# Patient Record
Sex: Male | Born: 1990 | Race: White | Hispanic: No | Marital: Single | Smoking: Former smoker
Health system: Southern US, Community
[De-identification: ages and names within clinical notes are randomized; demographics above are authoritative.]

## PROBLEM LIST (undated history)

## (undated) DIAGNOSIS — I10 Essential (primary) hypertension: Secondary | ICD-10-CM

## (undated) HISTORY — PX: KNEE ARTHROSCOPY WITH ANTERIOR CRUCIATE LIGAMENT (ACL) REPAIR: SHX5644

---
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
## 2011-06-30 ENCOUNTER — Ambulatory Visit: Payer: Self-pay | Admitting: Gastroenterology

## 2011-07-02 LAB — PATHOLOGY REPORT

## 2016-02-03 ENCOUNTER — Other Ambulatory Visit: Payer: Self-pay | Admitting: Family Medicine

## 2016-02-04 LAB — CMP12+LP+TP+TSH+6AC+CBC/D/PLT
ALBUMIN: 4.5 g/dL (ref 3.5–5.5)
ALK PHOS: 96 IU/L (ref 39–117)
ALT: 29 IU/L (ref 0–44)
AST: 17 IU/L (ref 0–40)
Albumin/Globulin Ratio: 2 (ref 1.1–2.5)
BASOS ABS: 0 10*3/uL (ref 0.0–0.2)
BASOS: 0 %
BILIRUBIN TOTAL: 0.7 mg/dL (ref 0.0–1.2)
BUN/Creatinine Ratio: 16 (ref 8–19)
BUN: 16 mg/dL (ref 6–20)
CHOLESTEROL TOTAL: 200 mg/dL — AB (ref 100–199)
Calcium: 9.4 mg/dL (ref 8.7–10.2)
Chloride: 102 mmol/L (ref 96–106)
Chol/HDL Ratio: 4.9 ratio units (ref 0.0–5.0)
Creatinine, Ser: 1 mg/dL (ref 0.76–1.27)
EOS (ABSOLUTE): 0 10*3/uL (ref 0.0–0.4)
Eos: 1 %
Estimated CHD Risk: 1 times avg. (ref 0.0–1.0)
FREE THYROXINE INDEX: 2.9 (ref 1.2–4.9)
GFR calc Af Amer: 122 mL/min/{1.73_m2} (ref >59)
GFR calc non Af Amer: 106 mL/min/{1.73_m2} (ref >59)
GGT: 20 IU/L (ref 0–65)
GLOBULIN, TOTAL: 2.3 g/dL (ref 1.5–4.5)
Glucose: 90 mg/dL (ref 65–99)
HDL: 41 mg/dL (ref >39)
HEMATOCRIT: 48.9 % (ref 37.5–51.0)
HEMOGLOBIN: 16.5 g/dL (ref 12.6–17.7)
IMMATURE GRANS (ABS): 0 10*3/uL (ref 0.0–0.1)
Immature Granulocytes: 0 %
Iron: 148 ug/dL (ref 38–169)
LDH: 164 IU/L (ref 121–224)
LDL CALC: 124 mg/dL — AB (ref 0–99)
LYMPHS: 29 %
Lymphocytes Absolute: 2.1 10*3/uL (ref 0.7–3.1)
MCH: 29.2 pg (ref 26.6–33.0)
MCHC: 33.7 g/dL (ref 31.5–35.7)
MCV: 87 fL (ref 79–97)
MONOS ABS: 1 10*3/uL — AB (ref 0.1–0.9)
Monocytes: 14 %
Neutrophils Absolute: 4 10*3/uL (ref 1.4–7.0)
Neutrophils: 56 %
PLATELETS: 235 10*3/uL (ref 150–379)
Phosphorus: 3.2 mg/dL (ref 2.5–4.5)
Potassium: 4.1 mmol/L (ref 3.5–5.2)
RBC: 5.65 x10E6/uL (ref 4.14–5.80)
RDW: 14 % (ref 12.3–15.4)
SODIUM: 142 mmol/L (ref 134–144)
T3 UPTAKE RATIO: 30 % (ref 24–39)
T4, Total: 9.6 ug/dL (ref 4.5–12.0)
TRIGLYCERIDES: 177 mg/dL — AB (ref 0–149)
TSH: 3.71 u[IU]/mL (ref 0.450–4.500)
Total Protein: 6.8 g/dL (ref 6.0–8.5)
Uric Acid: 7.4 mg/dL (ref 3.7–8.6)
VLDL CHOLESTEROL CAL: 35 mg/dL (ref 5–40)
WBC: 7.2 10*3/uL (ref 3.4–10.8)

## 2016-02-04 LAB — HEPATITIS B SURFACE ANTIBODY,QUALITATIVE: Hep B Surface Ab, Qual: NONREACTIVE

## 2017-04-26 ENCOUNTER — Encounter: Payer: Self-pay | Admitting: Physician Assistant

## 2017-04-26 ENCOUNTER — Ambulatory Visit: Payer: Self-pay | Admitting: Physician Assistant

## 2017-04-26 VITALS — BP 110/69 | HR 69 | Temp 98.5°F

## 2017-04-26 DIAGNOSIS — L237 Allergic contact dermatitis due to plants, except food: Secondary | ICD-10-CM

## 2017-04-26 MED ORDER — DEXAMETHASONE SODIUM PHOSPHATE 10 MG/ML IJ SOLN
10.0000 mg | Freq: Once | INTRAMUSCULAR | Status: AC
  Administered 2017-04-26: 10 mg via INTRAMUSCULAR

## 2017-04-26 MED ORDER — TRIAMCINOLONE ACETONIDE 0.1 % EX CREA: 1.0000 "application " | TOPICAL_CREAM | Freq: Two times a day (BID) | CUTANEOUS | 0 refills | Status: AC

## 2017-04-26 NOTE — Progress Notes (Signed)
S: c/o itchy rash on arms, legs, was outside in yard and then broke out, sx for few days, tried multiple otc meds without relief, denies fever/chills  O: vitals wnl, nad, lungs c t a, cv rrr, skin with small raised red areas on arms, large patches on back of knee and thigh,  some with streaks/blisters, no drainage, n/v intact  A: acute contact dermatitis  P: decadron 10 mg im, triamcinolone cream

## 2017-05-18 ENCOUNTER — Ambulatory Visit: Payer: Self-pay | Admitting: Physician Assistant

## 2017-05-27 ENCOUNTER — Ambulatory Visit
Admission: RE | Admit: 2017-05-27 | Discharge: 2017-05-27 | Disposition: A | Discharge status: Home / Self Care | Payer: Worker's Compensation | Source: Ambulatory Visit | Attending: Family | Admitting: Family

## 2017-05-27 ENCOUNTER — Other Ambulatory Visit: Payer: Self-pay | Admitting: Family

## 2017-05-27 DIAGNOSIS — R52 Pain, unspecified: Secondary | ICD-10-CM | POA: Diagnosis not present

## 2017-05-27 DIAGNOSIS — M25561 Pain in right knee: Secondary | ICD-10-CM | POA: Insufficient documentation

## 2017-06-23 ENCOUNTER — Ambulatory Visit: Payer: Self-pay | Admitting: Physician Assistant

## 2017-06-23 ENCOUNTER — Encounter: Payer: Self-pay | Admitting: Physician Assistant

## 2017-06-23 VITALS — BP 110/80 | HR 95 | Temp 98.5°F | Resp 16 | Ht 77.0 in | Wt 303.0 lb

## 2017-06-23 DIAGNOSIS — Z0000 Encounter for general adult medical examination without abnormal findings: Secondary | ICD-10-CM

## 2017-06-23 DIAGNOSIS — Z008 Encounter for other general examination: Secondary | ICD-10-CM

## 2017-06-23 DIAGNOSIS — Z0189 Encounter for other specified special examinations: Secondary | ICD-10-CM

## 2017-06-23 NOTE — Progress Notes (Signed)
S: pt here for wellness physical and biometrics for insurance purposes, no complaints ros neg. PMH: neg Social: neg Fam: mother has htn, father had bilateral PEs, no known cause except for ?air travel, takes coumadin daily  O: vitals wnl, nad, ENT wnl, neck supple no lymph, lungs c t a, cv rrr, abd soft nontender bs normal all 4 quads  A: wellness, biometric physical  P: labs

## 2017-06-24 LAB — CMP12+LP+TP+TSH+6AC+CBC/D/PLT
A/G RATIO: 1.9 (ref 1.2–2.2)
ALBUMIN: 4.7 g/dL (ref 3.5–5.5)
ALT: 34 IU/L (ref 0–44)
AST: 18 IU/L (ref 0–40)
Alkaline Phosphatase: 80 IU/L (ref 39–117)
BUN/Creatinine Ratio: 11 (ref 9–20)
BUN: 11 mg/dL (ref 6–20)
Basophils Absolute: 0 10*3/uL (ref 0.0–0.2)
Basos: 0 %
Bilirubin Total: 0.2 mg/dL (ref 0.0–1.2)
CREATININE: 0.96 mg/dL (ref 0.76–1.27)
Calcium: 10.1 mg/dL (ref 8.7–10.2)
Chloride: 106 mmol/L (ref 96–106)
Chol/HDL Ratio: 4.6 ratio (ref 0.0–5.0)
Cholesterol, Total: 211 mg/dL — ABNORMAL HIGH (ref 100–199)
EOS (ABSOLUTE): 0.1 10*3/uL (ref 0.0–0.4)
Eos: 1 %
Estimated CHD Risk: 0.9 times avg. (ref 0.0–1.0)
Free Thyroxine Index: 2.3 (ref 1.2–4.9)
GFR calc Af Amer: 126 mL/min/{1.73_m2} (ref >59)
GFR calc non Af Amer: 109 mL/min/{1.73_m2} (ref >59)
GGT: 26 IU/L (ref 0–65)
Globulin, Total: 2.5 g/dL (ref 1.5–4.5)
Glucose: 106 mg/dL — ABNORMAL HIGH (ref 65–99)
HDL: 46 mg/dL (ref >39)
Hematocrit: 47.8 % (ref 37.5–51.0)
Hemoglobin: 16 g/dL (ref 13.0–17.7)
IMMATURE GRANS (ABS): 0 10*3/uL (ref 0.0–0.1)
IRON: 47 ug/dL (ref 38–169)
Immature Granulocytes: 1 %
LDH: 191 IU/L (ref 121–224)
LDL Calculated: 137 mg/dL — ABNORMAL HIGH (ref 0–99)
Lymphocytes Absolute: 2 10*3/uL (ref 0.7–3.1)
Lymphs: 25 %
MCH: 29.4 pg (ref 26.6–33.0)
MCHC: 33.5 g/dL (ref 31.5–35.7)
MCV: 88 fL (ref 79–97)
Monocytes Absolute: 1.1 10*3/uL — ABNORMAL HIGH (ref 0.1–0.9)
Monocytes: 15 %
Neutrophils Absolute: 4.6 10*3/uL (ref 1.4–7.0)
Neutrophils: 58 %
PHOSPHORUS: 3 mg/dL (ref 2.5–4.5)
Platelets: 249 10*3/uL (ref 150–379)
Potassium: 4.3 mmol/L (ref 3.5–5.2)
RBC: 5.44 x10E6/uL (ref 4.14–5.80)
RDW: 14 % (ref 12.3–15.4)
SODIUM: 145 mmol/L — AB (ref 134–144)
T3 Uptake Ratio: 26 % (ref 24–39)
T4, Total: 8.9 ug/dL (ref 4.5–12.0)
TSH: 4.64 u[IU]/mL — ABNORMAL HIGH (ref 0.450–4.500)
Total Protein: 7.2 g/dL (ref 6.0–8.5)
Triglycerides: 140 mg/dL (ref 0–149)
URIC ACID: 6.7 mg/dL (ref 3.7–8.6)
VLDL Cholesterol Cal: 28 mg/dL (ref 5–40)
WBC: 7.9 10*3/uL (ref 3.4–10.8)

## 2017-06-24 LAB — VITAMIN D 25 HYDROXY (VIT D DEFICIENCY, FRACTURES): Vit D, 25-Hydroxy: 28.5 ng/mL — ABNORMAL LOW (ref 30.0–100.0)

## 2018-12-02 ENCOUNTER — Ambulatory Visit
Admission: EM | Admit: 2018-12-02 | Discharge: 2018-12-02 | Disposition: A | Discharge status: Home / Self Care | Payer: Managed Care, Other (non HMO) | Attending: Family Medicine | Admitting: Family Medicine

## 2018-12-02 ENCOUNTER — Other Ambulatory Visit: Payer: Self-pay

## 2018-12-02 ENCOUNTER — Encounter: Payer: Self-pay | Admitting: Emergency Medicine

## 2018-12-02 DIAGNOSIS — Z0130 Encounter for examination of blood pressure without abnormal findings: Secondary | ICD-10-CM

## 2018-12-02 NOTE — Discharge Instructions (Addendum)
Blood pressure reading today at urgent care was normal: 132/86 Vital signs were normal: including pulse of 80, respirations and O2 saturation of 98%

## 2018-12-02 NOTE — ED Triage Notes (Addendum)
Patient in today stating that his blood pressure has been 160s/110s for the last few days. Patient states that he has had this problem off & on. 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 He states he needs medication and a note stating that he can do physical activity.

## 2018-12-02 NOTE — ED Provider Notes (Signed)
[facility]    CSN: [id number] Arrival date & time: 12/02/18  1605     History   Chief Complaint Chief Complaint  Patient presents with  . Hypertension    HPI Maudry DiegoMichael Alexander Ovens is a 27 y.o. male.   27 yo male here for blood pressure check. States he's had it checked in class for BLET and it's been high, however patient states that at home he checks it and it's normal. Denies any chest pains or shortness of breath.   The history is provided by the patient.  Hypertension     History reviewed. No pertinent past medical history.  There are no active problems to display for this patient.   Past Surgical History:  Procedure Laterality Date  . KNEE ARTHROSCOPY WITH ANTERIOR CRUCIATE LIGAMENT (ACL) REPAIR Right        Home Medications    Prior to Admission medications   Medication Sig Start Date End Date Taking? Authorizing Provider  triamcinolone cream (KENALOG) 0.1 % Apply 1 application topically 2 (two) times daily. Patient not taking: Reported on 06/23/2017 04/26/17   Faythe GheeFisher, Susan W, PA-C    Family History Family History  Problem Relation Age of Onset  . Hypertension Mother   . Pulmonary embolism Father   . Early death Neg Hx   . Diabetes Neg Hx   . Stroke Neg Hx   . Cancer Neg Hx     Social History Social History   Tobacco Use  . Smoking status: Former Smoker    Last attempt to quit: 12/02/2013    Years since quitting: 5.0  . Smokeless tobacco: Never Used  Substance Use Topics  . Alcohol use: No  . Drug use: No     Allergies   Patient has no known allergies.   Review of Systems Review of Systems   Physical Exam Triage Vital Signs ED Triage Vitals  Enc Vitals Group     BP 12/02/18 1621 132/86     Pulse Rate 12/02/18 1621 80     Resp 12/02/18 1621 16     Temp 12/02/18 1621 98.1 F (36.7 C)     Temp Source 12/02/18 1621 Oral     SpO2 12/02/18 1621 98 %     Weight 12/02/18 1622 290 lb (131.5 kg)     Height 12/02/18  1622 6\' 5"  (1.956 m)     Head Circumference --      Peak Flow --      Pain Score 12/02/18 1621 0     Pain Loc --      Pain Edu? --      Excl. in GC? --    No data found.  Updated Vital Signs BP 132/86 (BP Location: Left Arm)   Pulse 80   Temp 98.1 F (36.7 C) (Oral)   Resp 16   Ht 6\' 5"  (1.956 m)   Wt 131.5 kg   SpO2 98%   BMI 34.39 kg/m   Visual Acuity Right Eye Distance:   Left Eye Distance:   Bilateral Distance:    Right Eye Near:   Left Eye Near:    Bilateral Near:     Physical Exam  Constitutional: He appears well-developed and well-nourished. No distress.  Skin: He is not diaphoretic.  Nursing note and vitals reviewed.    UC Treatments / Results  Labs (all labs ordered are listed, but only abnormal results are displayed) Labs Reviewed - No data to display  EKG None  Radiology  No results found.  Procedures Procedures (including critical care time)  Medications Ordered in UC Medications - No data to display  Initial Impression / Assessment and Plan / UC Course  I have reviewed the triage vital signs and the nursing notes.  Pertinent labs & imaging results that were available during my care of the patient were reviewed by me and considered in my medical decision making (see chart for details).      Final Clinical Impressions(s) / UC Diagnoses   Final diagnoses:  Blood pressure check     Discharge Instructions     Blood pressure reading today at urgent care was normal: 132/86 Vital signs were normal: including pulse of 80, respirations and O2 saturation of 98%    ED Prescriptions    None     1. diagnosis reviewed with patient 2. Recommend patient establish care with a primary care provider 3. Continue monitoring  4. Follow-up prn Controlled Substance Prescriptions [place] Controlled Substance Registry consulted? Not Applicable   Payton Mccallum, MD 12/02/18 [phone]

## 2019-10-09 ENCOUNTER — Emergency Department
Admission: EM | Admit: 2019-10-09 | Discharge: 2019-10-09 | Disposition: A | Discharge status: Home / Self Care | Payer: Managed Care, Other (non HMO) | Attending: Emergency Medicine | Admitting: Emergency Medicine

## 2019-10-09 ENCOUNTER — Other Ambulatory Visit: Payer: Self-pay

## 2019-10-09 ENCOUNTER — Emergency Department: Payer: Managed Care, Other (non HMO)

## 2019-10-09 ENCOUNTER — Encounter: Payer: Self-pay | Admitting: Emergency Medicine

## 2019-10-09 DIAGNOSIS — F17200 Nicotine dependence, unspecified, uncomplicated: Secondary | ICD-10-CM

## 2019-10-09 DIAGNOSIS — R002 Palpitations: Secondary | ICD-10-CM | POA: Diagnosis not present

## 2019-10-09 DIAGNOSIS — F17290 Nicotine dependence, other tobacco product, uncomplicated: Secondary | ICD-10-CM | POA: Insufficient documentation

## 2019-10-09 DIAGNOSIS — Z789 Other specified health status: Secondary | ICD-10-CM

## 2019-10-09 DIAGNOSIS — F1090 Alcohol use, unspecified, uncomplicated: Secondary | ICD-10-CM

## 2019-10-09 DIAGNOSIS — R000 Tachycardia, unspecified: Secondary | ICD-10-CM | POA: Diagnosis present

## 2019-10-09 LAB — URINE DRUG SCREEN, QUALITATIVE (ARMC ONLY)
Amphetamines, Ur Screen: NOT DETECTED
Barbiturates, Ur Screen: NOT DETECTED
Benzodiazepine, Ur Scrn: NOT DETECTED
Cannabinoid 50 Ng, Ur ~~LOC~~: NOT DETECTED
Cocaine Metabolite,Ur ~~LOC~~: NOT DETECTED
MDMA (Ecstasy)Ur Screen: NOT DETECTED
Methadone Scn, Ur: NOT DETECTED
Opiate, Ur Screen: NOT DETECTED
Phencyclidine (PCP) Ur S: NOT DETECTED
Tricyclic, Ur Screen: NOT DETECTED

## 2019-10-09 LAB — CBC
HCT: 47.7 % (ref 39.0–52.0)
Hemoglobin: 16.2 g/dL (ref 13.0–17.0)
MCH: 29.9 pg (ref 26.0–34.0)
MCHC: 34 g/dL (ref 30.0–36.0)
MCV: 88 fL (ref 80.0–100.0)
Platelets: 265 10*3/uL (ref 150–400)
RBC: 5.42 MIL/uL (ref 4.22–5.81)
RDW: 12.9 % (ref 11.5–15.5)
WBC: 9.1 10*3/uL (ref 4.0–10.5)
nRBC: 0 % (ref 0.0–0.2)

## 2019-10-09 LAB — BASIC METABOLIC PANEL
Anion gap: 7 (ref 5–15)
BUN: 15 mg/dL (ref 6–20)
CO2: 24 mmol/L (ref 22–32)
Calcium: 9.4 mg/dL (ref 8.9–10.3)
Chloride: 109 mmol/L (ref 98–111)
Creatinine, Ser: 1.2 mg/dL (ref 0.61–1.24)
GFR calc Af Amer: >60 mL/min (ref >60)
GFR calc non Af Amer: >60 mL/min (ref >60)
Glucose, Bld: 110 mg/dL — ABNORMAL HIGH (ref 70–99)
Potassium: 3.9 mmol/L (ref 3.5–5.1)
Sodium: 140 mmol/L (ref 135–145)

## 2019-10-09 LAB — TROPONIN I (HIGH SENSITIVITY)
Troponin I (High Sensitivity): 12 ng/L (ref <18)
Troponin I (High Sensitivity): 6 ng/L (ref <18)

## 2019-10-09 NOTE — ED Triage Notes (Signed)
Pt arrives POV to triage with c/o rapid heart rate. Pt states that he noticed it around 0100 and has not been able to sleep because of it. Pt is in NAD.

## 2019-10-09 NOTE — ED Provider Notes (Signed)
[facility] Emergency Department Provider Note  ____________________________________________  Time seen: Approximately 6:14 AM  I have reviewed the triage vital signs and the nursing notes.   HISTORY  Chief Complaint Tachycardia   HPI Justin Watts is a 28 y.o. male no significant past medical history who presents for evaluation of palpitations.  Patient reports that he kept having palpitations every time he was about to fall asleep this evening.  Had some muscle twitching/spasms on the right side of his chest wall associated with it.  Patient reports that he drank a lot of alcohol last night.  Also he used a new nicotine vape liquid.  He reports having a similar episode several months ago also when trying a new nicotine vape.  He denies drug use or marijuana.  He denies chest pain or shortness of breath, no fever or chills, no cough, no nausea, vomiting, and abdominal pain.  No personal history of blood clots, no recent travel immobilization, no leg pain or swelling, no hemoptysis or exogenous hormones.   PMH None - reviewed  Past Surgical History:  Procedure Laterality Date  . KNEE ARTHROSCOPY WITH ANTERIOR CRUCIATE LIGAMENT (ACL) REPAIR Right     Prior to Admission medications   Medication Sig Start Date End Date Taking? Authorizing Provider  triamcinolone cream (KENALOG) 0.1 % Apply 1 application topically 2 (two) times daily. Patient not taking: Reported on 06/23/2017 04/26/17   Faythe Ghee, PA-C    Allergies Patient has no known allergies.  Family History  Problem Relation Age of Onset  . Hypertension Mother   . Pulmonary embolism Father   . Early death Neg Hx   . Diabetes Neg Hx   . Stroke Neg Hx   . Cancer Neg Hx     Social History Social History   Tobacco Use  . Smoking status: Former Smoker    Types: E-cigarettes    Quit date: 12/02/2013    Years since quitting: 5.8  . Smokeless tobacco: Never Used  Substance Use  Topics  . Alcohol use: No  . Drug use: No    Review of Systems  Constitutional: Negative for fever. Eyes: Negative for visual changes. ENT: Negative for sore throat. Neck: No neck pain  Cardiovascular: Negative for chest pain. + palpitations Respiratory: Negative for shortness of breath. Gastrointestinal: Negative for abdominal pain, vomiting or diarrhea. Genitourinary: Negative for dysuria. Musculoskeletal: Negative for back pain. Skin: Negative for rash. Neurological: Negative for headaches, weakness or numbness. Psych: No SI or HI  ____________________________________________   PHYSICAL EXAM:  VITAL SIGNS: ED Triage Vitals  Enc Vitals Group     BP 10/09/19 0251 (!) 149/99     Pulse Rate 10/09/19 0251 96     Resp 10/09/19 0251 18     Temp 10/09/19 0251 98.5 F (36.9 C)     Temp Source 10/09/19 0251 Oral     SpO2 10/09/19 0251 98 %     Weight 10/09/19 0248 280 lb (127 kg)     Height 10/09/19 0248 6\' 5"  (1.956 m)     Head Circumference --      Peak Flow --      Pain Score 10/09/19 0248 2     Pain Loc --      Pain Edu? --      Excl. in GC? --     Constitutional: Alert and oriented. Well appearing and in no apparent distress. HEENT:      Head: Normocephalic and atraumatic.  Eyes: Conjunctivae are normal. Sclera is non-icteric.       Mouth/Throat: Mucous membranes are moist.       Neck: Supple with no signs of meningismus. Cardiovascular: Regular rate and rhythm. No murmurs, gallops, or rubs. 2+ symmetrical distal pulses are present in all extremities. No JVD. Respiratory: Normal respiratory effort. Lungs are clear to auscultation bilaterally. No wheezes, crackles, or rhonchi.  Gastrointestinal: Soft, non tender, and non distended with positive bowel sounds. No rebound or guarding. Musculoskeletal: Nontender with normal range of motion in all extremities. No edema, cyanosis, or erythema of extremities. Neurologic: Normal speech and language. Face is  symmetric. Moving all extremities. No gross focal neurologic deficits are appreciated. Skin: Skin is warm, dry and intact. No rash noted. Psychiatric: Mood and affect are normal. Speech and behavior are normal.  ____________________________________________   LABS (all labs ordered are listed, but only abnormal results are displayed)  Labs Reviewed  BASIC METABOLIC PANEL - Abnormal; Notable for the following components:      Result Value   Glucose, Bld 110 (*)    All other components within normal limits  CBC  URINE DRUG SCREEN, QUALITATIVE ([facility] ONLY)  TROPONIN I (HIGH SENSITIVITY)  TROPONIN I (HIGH SENSITIVITY)  TROPONIN I (HIGH SENSITIVITY)   ____________________________________________  EKG  ED ECG REPORT I, Rudene Re, the attending physician, personally viewed and interpreted this ECG.  Normal sinus rhythm, normal intervals, normal axis, no STE or depressions, no evidence of HOCM, AV block, delta wave, ARVD, prolonged QTc, WPW, or Brugada.   ____________________________________________  RADIOLOGY  I have personally reviewed the images performed during this visit and I agree with the Radiologist's read.   Interpretation by Radiologist:  Dg Chest 2 View  Result Date: 10/09/2019 CLINICAL DATA:  Palpitations EXAM: CHEST - 2 VIEW COMPARISON:  None. FINDINGS: No consolidation, features of edema, pneumothorax, or effusion. Pulmonary vascularity is normally distributed. The cardiomediastinal contours are unremarkable. No acute osseous or soft tissue abnormality. IMPRESSION: No acute cardiopulmonary abnormality. Electronically Signed   By: Lovena Le M.D.   On: 10/09/2019 03:15     ____________________________________________   PROCEDURES  Procedure(s) performed: None Procedures Critical Care performed:  None ____________________________________________   INITIAL IMPRESSION / ASSESSMENT AND PLAN / ED COURSE  28 y.o. male no significant past medical history  who presents for evaluation of palpitations the setting of using new nicotine vape and heavy alcohol use.  EKG with no evidence of dysrhythmias or ischemia.  Vitals are within normal limits.  No murmurs, lungs are clear to auscultation.  Patient is PERC negative and has had full resolution of his symptoms plus no CP, SOB, hypoxia, tachycardia, or tachypnea.  No COVID-like symptoms. Labs dehydration, AKI, electrolyte abnormalities, anemia.  Troponin x2-.  Monitor on telemetry with no signs of dysrhythmias.  Recommended increase oral hydration avoiding alcohol and smoking for the next 48 hours.  Also recommended close follow-up with PCP for possible Holter monitoring if these episodes keep happening.  Discussed my return precautions for recurrence of palpitations, chest pain, shortness of breath, dizziness.       As part of my medical decision making, I reviewed the following data within the [place] notes reviewed and incorporated, Labs reviewed , EKG interpreted , Old chart reviewed, Radiograph reviewed , Notes from prior ED visits and [place] Controlled Substance Database   Patient was evaluated in Emergency Department today for the symptoms described in the history of present illness. Patient was evaluated in the  context of the global COVID-19 pandemic, which necessitated consideration that the patient might be at risk for infection with the SARS-CoV-2 virus that causes COVID-19. Institutional protocols and algorithms that pertain to the evaluation of patients at risk for COVID-19 are in a state of rapid change based on information released by regulatory bodies including the CDC and federal and state organizations. These policies and algorithms were followed during the patient's care in the ED.   ____________________________________________   FINAL CLINICAL IMPRESSION(S) / ED DIAGNOSES   Final diagnoses:  Palpitations  Heavy alcohol consumption  Vaping nicotine  dependence, non-tobacco product      NEW MEDICATIONS STARTED DURING THIS VISIT:  ED Discharge Orders    None       Note:  This document was prepared using Dragon voice recognition software and may include unintentional dictation errors.    Nita SickleVeronese, [place], MD 10/09/19 [phone]

## 2019-10-09 NOTE — ED Notes (Signed)
Report to kim, rn.  

## 2019-10-09 NOTE — ED Notes (Signed)
Pt states he was going to sleep around 0100 tonight when he began to feel his heart race. Pt states has happened in past when he switched to a different vape pen, which he did again yesterday. Pt states he did feel shob during episode, denies fever, cough. Pt appears in no acute distress.

## 2019-11-02 ENCOUNTER — Other Ambulatory Visit: Payer: Self-pay

## 2019-11-02 DIAGNOSIS — Z20822 Contact with and (suspected) exposure to covid-19: Secondary | ICD-10-CM

## 2019-11-03 LAB — NOVEL CORONAVIRUS, NAA: SARS-CoV-2, NAA: NOT DETECTED

## 2020-09-17 ENCOUNTER — Other Ambulatory Visit: Payer: Self-pay

## 2020-09-17 ENCOUNTER — Emergency Department: Payer: Managed Care, Other (non HMO)

## 2020-09-17 ENCOUNTER — Emergency Department
Admission: EM | Admit: 2020-09-17 | Discharge: 2020-09-17 | Disposition: A | Discharge status: Home / Self Care | Payer: Managed Care, Other (non HMO) | Attending: Student in an Organized Health Care Education/Training Program | Admitting: Student in an Organized Health Care Education/Training Program

## 2020-09-17 DIAGNOSIS — R109 Unspecified abdominal pain: Secondary | ICD-10-CM | POA: Insufficient documentation

## 2020-09-17 DIAGNOSIS — Z87891 Personal history of nicotine dependence: Secondary | ICD-10-CM | POA: Insufficient documentation

## 2020-09-17 DIAGNOSIS — I10 Essential (primary) hypertension: Secondary | ICD-10-CM | POA: Insufficient documentation

## 2020-09-17 DIAGNOSIS — R0789 Other chest pain: Secondary | ICD-10-CM | POA: Diagnosis present

## 2020-09-17 DIAGNOSIS — Z96651 Presence of right artificial knee joint: Secondary | ICD-10-CM | POA: Diagnosis not present

## 2020-09-17 DIAGNOSIS — Z20822 Contact with and (suspected) exposure to covid-19: Secondary | ICD-10-CM | POA: Diagnosis not present

## 2020-09-17 DIAGNOSIS — R079 Chest pain, unspecified: Secondary | ICD-10-CM

## 2020-09-17 DIAGNOSIS — R10A1 Flank pain, right side: Secondary | ICD-10-CM

## 2020-09-17 HISTORY — DX: Essential (primary) hypertension: I10

## 2020-09-17 LAB — CBC
HCT: 47.4 % (ref 39.0–52.0)
Hemoglobin: 16.9 g/dL (ref 13.0–17.0)
MCH: 30.8 pg (ref 26.0–34.0)
MCHC: 35.7 g/dL (ref 30.0–36.0)
MCV: 86.3 fL (ref 80.0–100.0)
Platelets: 277 10*3/uL (ref 150–400)
RBC: 5.49 MIL/uL (ref 4.22–5.81)
RDW: 13.3 % (ref 11.5–15.5)
WBC: 8.3 10*3/uL (ref 4.0–10.5)
nRBC: 0 % (ref 0.0–0.2)

## 2020-09-17 LAB — BASIC METABOLIC PANEL
Anion gap: 7 (ref 5–15)
BUN: 13 mg/dL (ref 6–20)
CO2: 26 mmol/L (ref 22–32)
Calcium: 9.1 mg/dL (ref 8.9–10.3)
Chloride: 105 mmol/L (ref 98–111)
Creatinine, Ser: 1.02 mg/dL (ref 0.61–1.24)
GFR calc Af Amer: >60 mL/min (ref >60)
GFR calc non Af Amer: >60 mL/min (ref >60)
Glucose, Bld: 99 mg/dL (ref 70–99)
Potassium: 4 mmol/L (ref 3.5–5.1)
Sodium: 138 mmol/L (ref 135–145)

## 2020-09-17 LAB — RESP PANEL BY RT PCR (RSV, FLU A&B, COVID)
Influenza A by PCR: NEGATIVE
Influenza B by PCR: NEGATIVE
Respiratory Syncytial Virus by PCR: NEGATIVE
SARS Coronavirus 2 by RT PCR: NEGATIVE

## 2020-09-17 LAB — URINALYSIS, COMPLETE (UACMP) WITH MICROSCOPIC
Bacteria, UA: NONE SEEN
Bilirubin Urine: NEGATIVE
Glucose, UA: NEGATIVE mg/dL
Hgb urine dipstick: NEGATIVE
Ketones, ur: NEGATIVE mg/dL
Leukocytes,Ua: NEGATIVE
Nitrite: NEGATIVE
Protein, ur: NEGATIVE mg/dL
Specific Gravity, Urine: 1.024 (ref 1.005–1.030)
pH: 5 (ref 5.0–8.0)

## 2020-09-17 LAB — TROPONIN I (HIGH SENSITIVITY)
Troponin I (High Sensitivity): 4 ng/L (ref <18)
Troponin I (High Sensitivity): 3 ng/L (ref <18)

## 2020-09-17 LAB — HEPATIC FUNCTION PANEL
ALT: 49 U/L — ABNORMAL HIGH (ref 0–44)
AST: 25 U/L (ref 15–41)
Albumin: 3.9 g/dL (ref 3.5–5.0)
Alkaline Phosphatase: 58 U/L (ref 38–126)
Bilirubin, Direct: 0.1 mg/dL (ref 0.0–0.2)
Indirect Bilirubin: 0.6 mg/dL (ref 0.3–0.9)
Total Bilirubin: 0.7 mg/dL (ref 0.3–1.2)
Total Protein: 7.1 g/dL (ref 6.5–8.1)

## 2020-09-17 LAB — FIBRIN DERIVATIVES D-DIMER (ARMC ONLY): Fibrin derivatives D-dimer (ARMC): 154.35 ng/mL (FEU) (ref 0.00–499.00)

## 2020-09-17 LAB — LIPASE, BLOOD: Lipase: 56 U/L — ABNORMAL HIGH (ref 11–51)

## 2020-09-17 MED ORDER — LORAZEPAM 0.5 MG PO TABS: 0.5000 mg | ORAL_TABLET | Freq: Three times a day (TID) | ORAL | 0 refills | Status: AC | PRN

## 2020-09-17 MED ORDER — LORAZEPAM 1 MG PO TABS
1.0000 mg | ORAL_TABLET | Freq: Once | ORAL | Status: AC
  Administered 2020-09-17: 1 mg via ORAL
  Filled 2020-09-17: qty 1

## 2020-09-17 MED ORDER — KETOROLAC TROMETHAMINE 30 MG/ML IJ SOLN
15.0000 mg | Freq: Once | INTRAMUSCULAR | Status: AC
  Administered 2020-09-17: 15 mg via INTRAMUSCULAR
  Filled 2020-09-17: qty 1

## 2020-09-17 MED ORDER — HYDROCODONE-ACETAMINOPHEN 5-325 MG PO TABS
1.0000 | ORAL_TABLET | Freq: Once | ORAL | Status: AC
  Administered 2020-09-17: 1 via ORAL
  Filled 2020-09-17: qty 1

## 2020-09-17 NOTE — ED Notes (Signed)
See triage note, pt reports right sided rib pain worsening with inspiration that started on Sunday. Denies fever/ COVID sx.  States urine is darker than it has been, denies other urinary sx.  Pt in NAD. RR even and unlabored.

## 2020-09-17 NOTE — ED Provider Notes (Signed)
[facility] Emergency Department Provider Note    First MD Initiated Contact with Patient 09/17/20 [phone]     (approximate)  I have reviewed the triage vital signs and the nursing notes.   HISTORY  Chief Complaint Flank Pain    HPI Justin Watts is a 29 y.o. male   with the below listed past medical history presents to the ER for evaluation of right anterior chest wall pain and discomfort since this weekend.  Denies any fevers.  No cough.  No shortness of breath.  It is worse with movement palpation of the location as well as taking deep inspiration.  Denies any cardiac or pulmonary history.  Does not smoke.  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  Denies any rashes.  No other aggravating or alleviating factors.  Tried some Motrin without significant improvement.  States he did have Covid 1 month ago.   Past Medical History:  Diagnosis Date  . Hypertension    Family History  Problem Relation Age of Onset  . Hypertension Mother   . Pulmonary embolism Father   . Early death Neg Hx   . Diabetes Neg Hx   . Stroke Neg Hx   . Cancer Neg Hx    Past Surgical History:  Procedure Laterality Date  . KNEE ARTHROSCOPY WITH ANTERIOR CRUCIATE LIGAMENT (ACL) REPAIR Right    There are no problems to display for this patient.     Prior to Admission medications   Medication Sig Start Date End Date Taking? Authorizing Provider  triamcinolone cream (KENALOG) 0.1 % Apply 1 application topically 2 (two) times daily. Patient not taking: Reported on 06/23/2017 04/26/17   Faythe Ghee, PA-C    Allergies Patient has no known allergies.    Social History Social History   Tobacco Use  . Smoking status: Former Smoker    Types: E-cigarettes    Quit date: 12/02/2013    Years since quitting: 6.7  . Smokeless tobacco: Never Used  Vaping Use  . Vaping Use: Some days  . Substances: Nicotine,  Flavoring  Substance Use Topics  . Alcohol use: Yes    Comment: occaisonal   . Drug use: No    Review of Systems Patient denies headaches, rhinorrhea, blurry vision, numbness, shortness of breath, chest pain, edema, cough, abdominal pain, nausea, vomiting, diarrhea, dysuria, fevers, rashes or hallucinations unless otherwise stated above in HPI. ____________________________________________   PHYSICAL EXAM:  VITAL SIGNS: Vitals:   09/17/20 0133  BP: (!) 151/98  Pulse: 79  Resp: 19  Temp: 98.2 F (36.8 C)  SpO2: 100%    Constitutional: Alert and oriented.  Eyes: Conjunctivae are normal.  Head: Atraumatic. Nose: No congestion/rhinnorhea. Mouth/Throat: Mucous membranes are moist.   Neck: No stridor. Painless ROM.  Cardiovascular: Normal rate, regular rhythm. Grossly normal heart sounds.  Good peripheral circulation. Respiratory: Normal respiratory effort.  No retractions. Lungs CTAB. Gastrointestinal: Soft and nontender. No distention. No abdominal bruits. No CVA tenderness. Genitourinary:  Musculoskeletal: Pain is reproducible with palpation of the anterior chest wall just under the right breast.  No fluctuance or masses noted.  No lower extremity tenderness nor edema.  No joint effusions. Neurologic:  Normal speech and language. No gross focal neurologic deficits are appreciated. No facial droop Skin:  Skin is warm, dry and intact. No rash noted. Psychiatric: Mood and affect are normal. Speech and behavior are normal.  ____________________________________________  LABS (all labs ordered are listed, but only abnormal results are displayed)  Results for orders placed or performed during the hospital encounter of 09/17/20 (from the past 24 hour(s))  Basic metabolic panel     Status: None   Collection Time: 09/17/20  1:41 AM  Result Value Ref Range   Sodium 138 135 - 145 mmol/L   Potassium 4.0 3.5 - 5.1 mmol/L   Chloride 105 98 - 111 mmol/L   CO2 26 22 - 32 mmol/L    Glucose, Bld 99 70 - 99 mg/dL   BUN 13 6 - 20 mg/dL   Creatinine, Ser 7.89 0.61 - 1.24 mg/dL   Calcium 9.1 8.9 - 38.1 mg/dL   GFR calc non Af Amer >60 >60 mL/min   GFR calc Af Amer >60 >60 mL/min   Anion gap 7 5 - 15  CBC     Status: None   Collection Time: 09/17/20  1:41 AM  Result Value Ref Range   WBC 8.3 4.0 - 10.5 K/uL   RBC 5.49 4.22 - 5.81 MIL/uL   Hemoglobin 16.9 13.0 - 17.0 g/dL   HCT 01.7 39 - 52 %   MCV 86.3 80.0 - 100.0 fL   MCH 30.8 26.0 - 34.0 pg   MCHC 35.7 30.0 - 36.0 g/dL   RDW 51.0 25.8 - 52.7 %   Platelets 277 150 - 400 K/uL   nRBC 0.0 0.0 - 0.2 %  Troponin I (High Sensitivity)     Status: None   Collection Time: 09/17/20  1:41 AM  Result Value Ref Range   Troponin I (High Sensitivity) 4 <18 ng/L  Fibrin derivatives D-Dimer ([facility] only)     Status: None   Collection Time: 09/17/20  1:41 AM  Result Value Ref Range   Fibrin derivatives D-dimer ([facility]) 154.35 0.00 - 499.00 ng/mL (FEU)  Resp Panel by RT PCR (RSV, Flu A&B, Covid) -     Status: None   Collection Time: 09/17/20  1:41 AM  Result Value Ref Range   SARS Coronavirus 2 by RT PCR NEGATIVE NEGATIVE   Influenza A by PCR NEGATIVE NEGATIVE   Influenza B by PCR NEGATIVE NEGATIVE   Respiratory Syncytial Virus by PCR NEGATIVE NEGATIVE   ____________________________________________  EKG My review and personal interpretation at Time: 1:31   Indication: chest pain  Rate: 75  Rhythm: sinus Axis: normal Other: normal intervals, no stemi ____________________________________________  RADIOLOGY  I personally reviewed all radiographic images ordered to evaluate for the above acute complaints and reviewed radiology reports and findings.  These findings were personally discussed with the patient.  Please see medical record for radiology report.  ____________________________________________   PROCEDURES  Procedure(s) performed:  Procedures    Critical Care performed:  no ____________________________________________   INITIAL IMPRESSION / ASSESSMENT AND PLAN / ED COURSE  Pertinent labs & imaging results that were available during my care of the patient were reviewed by me and considered in my medical decision making (see chart for details).   DDX: costochondritis, pericarditis, biliary path, hepatitis pe, dissection, pna, bronchitis, acs   Tavis Kring is a 29 y.o. who presents to the ED with presentation as described above.  Patient well and nontoxic-appearing.  His EKG is nonischemic and his troponin is negative.  He is low risk by Wells criteria, PERC negative and his D-dimer is normal.  This does not appear consistent with PE.  Given location of pain will add on liver enzymes lipase.  We will also  check urine as he states it has been darker than usual but clinically this seems most consistent with costochondritis or chest wall pain.  The patient will be placed on continuous pulse oximetry and telemetry for monitoring.  Laboratory evaluation will be sent to evaluate for the above complaints.     Clinical Course as of Sep 17 1140  Tue Sep 17, 2020  6045 Borderline elevation lipase and ALT, will order right upper quadrant ultrasound given location of pain.   [PR]  1132 Patient reassessed.  Repeat enzymes normal.  Lipase is borderline elevated symptoms do not seem consistent with acute pancreatitis in his right upper quadrant ultrasound is reassuring.  His location of pain would not be consistent with pancreatitis either.  I suspect this is component of mild dehydration.  Urinalysis is normal.  Patient does appear very anxious he does admit to having severe anxiety.  I suspect he has some sort of musculoskeletal or chest wall pain and I recommended anti-inflammatories but given his level of anxiety I do believe that short course of anxiolytic would be appropriate and will be given referral to outpatient mental health provider.   [PR]    Clinical  Course User Index [PR] Willy Eddy, MD    The patient was evaluated in Emergency Department today for the symptoms described in the history of present illness. He/she was evaluated in the context of the global COVID-19 pandemic, which necessitated consideration that the patient might be at risk for infection with the SARS-CoV-2 virus that causes COVID-19. Institutional protocols and algorithms that pertain to the evaluation of patients at risk for COVID-19 are in a state of rapid change based on information released by regulatory bodies including the CDC and federal and state organizations. These policies and algorithms were followed during the patient's care in the ED.  As part of my medical decision making, I reviewed the following data within the electronic MEDICAL RECORD NUMBER Nursing notes reviewed and incorporated, Labs reviewed, notes from prior ED visits and [place] Controlled Substance Database   ____________________________________________   FINAL CLINICAL IMPRESSION(S) / ED DIAGNOSES  Final diagnoses:  Right flank pain  Right-sided chest pain      NEW MEDICATIONS STARTED DURING THIS VISIT:  New Prescriptions   No medications on file     Note:  This document was prepared using Dragon voice recognition software and may include unintentional dictation errors.    Willy Eddy, MD 09/17/20 1141

## 2020-09-17 NOTE — ED Triage Notes (Signed)
Pt to ED c/o Right sided pain since Sunday night. Worse with inspiration, sharp in nature. Does not smoke cigarettes, no recent travel.  No urinary sx.

## 2020-09-17 NOTE — Discharge Instructions (Addendum)
Your work up has been reassuring.  I have sent a Rx to your pharmacy for ativan to be taken only as needed.  DO NOT driver or operate machinery after taking this medication as it can cause drowsiness.  I recommend you start taking Alleve 500mg  twice daily for 7 days to help with pain.

## 2020-09-17 NOTE — ED Notes (Signed)
Pt provided warm blanket and cup to provide urine sample when able

## 2021-03-15 IMAGING — CR DG CHEST 2V
1 series · 2 of 2 positions shown · non-contrast
Comparison: None.

CLINICAL DATA: Palpitations

EXAM:
CHEST - 2 VIEW

[Series 1: dg chest 2 view · 0.14mm/px · 2 of 2 slices shown]
[im 1/2]
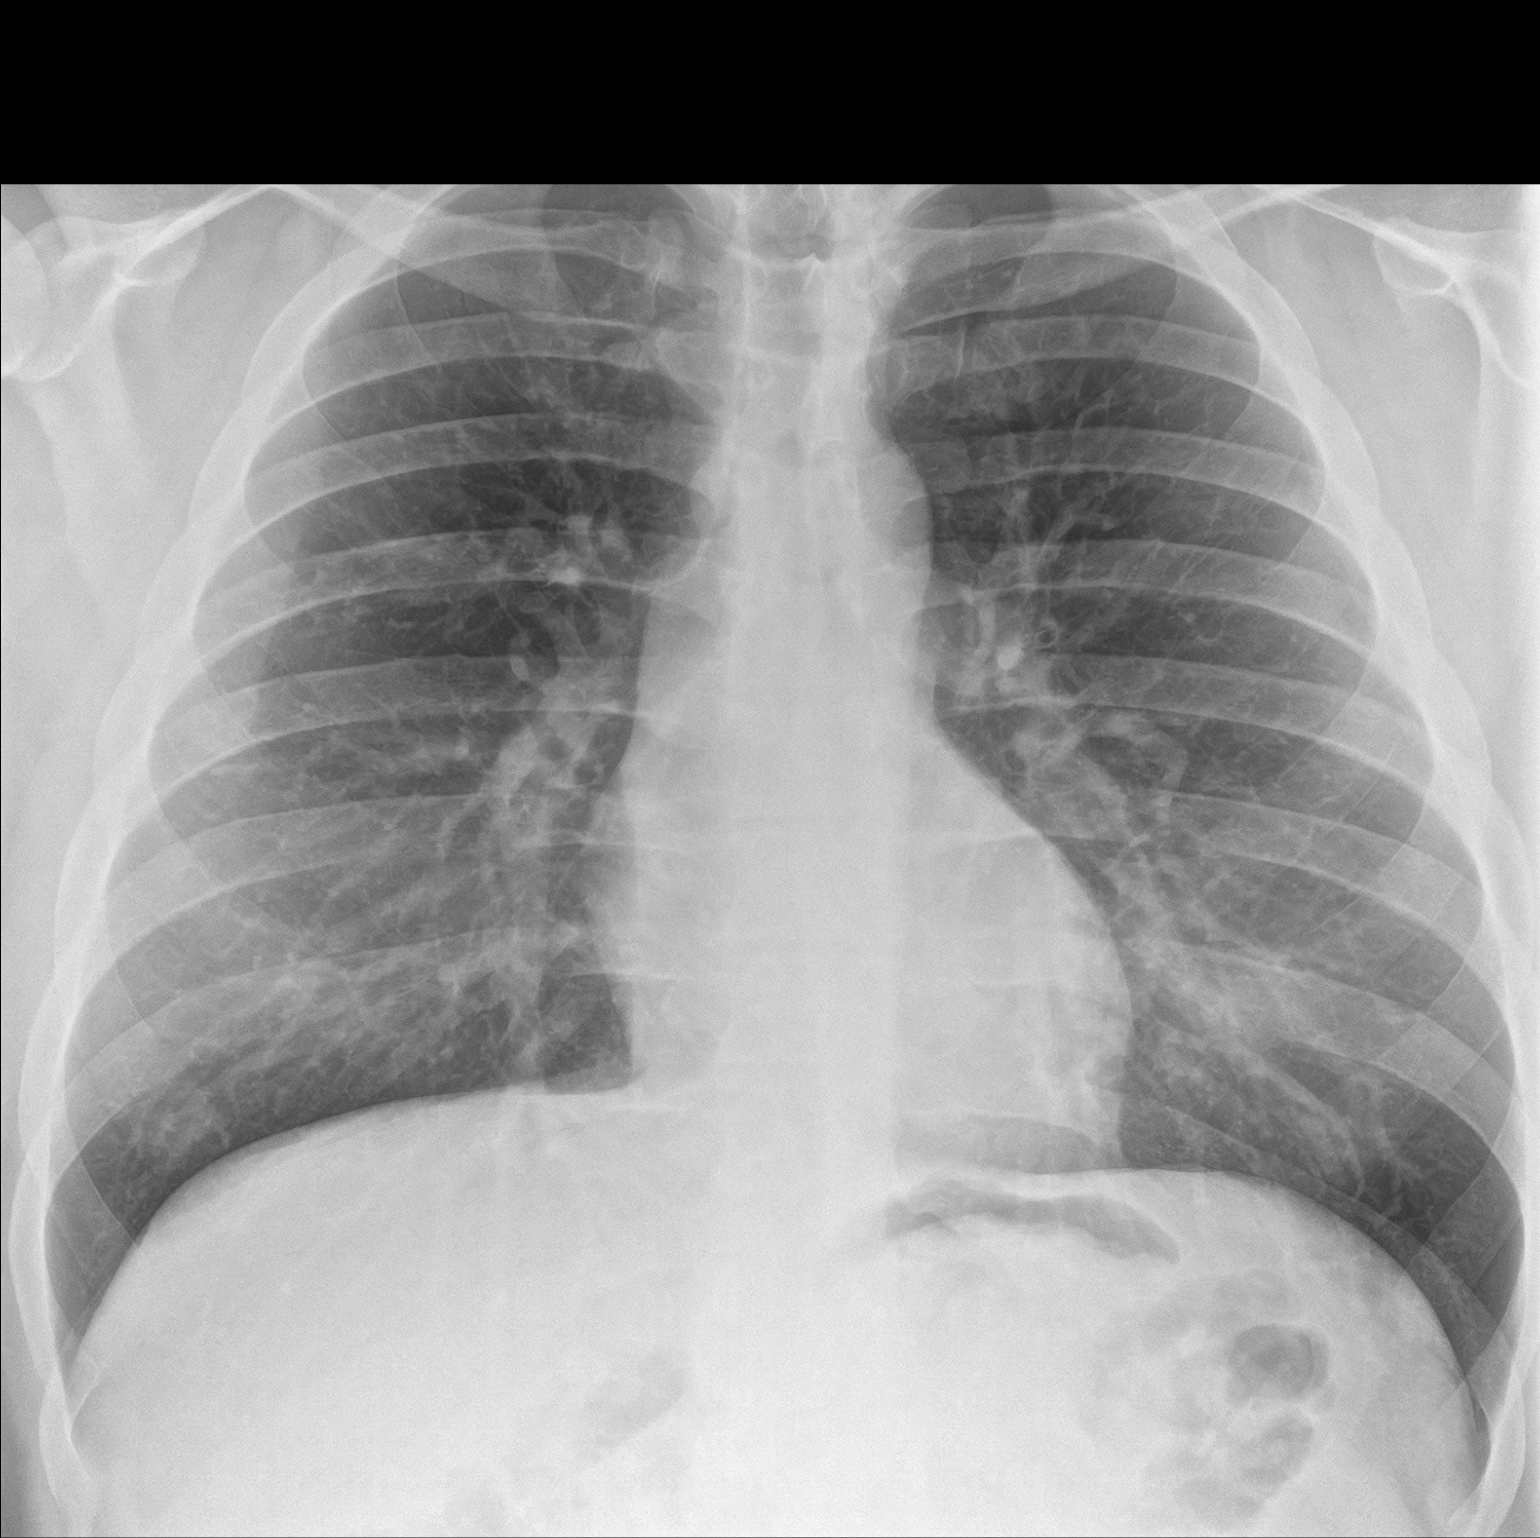
[im 2/2]
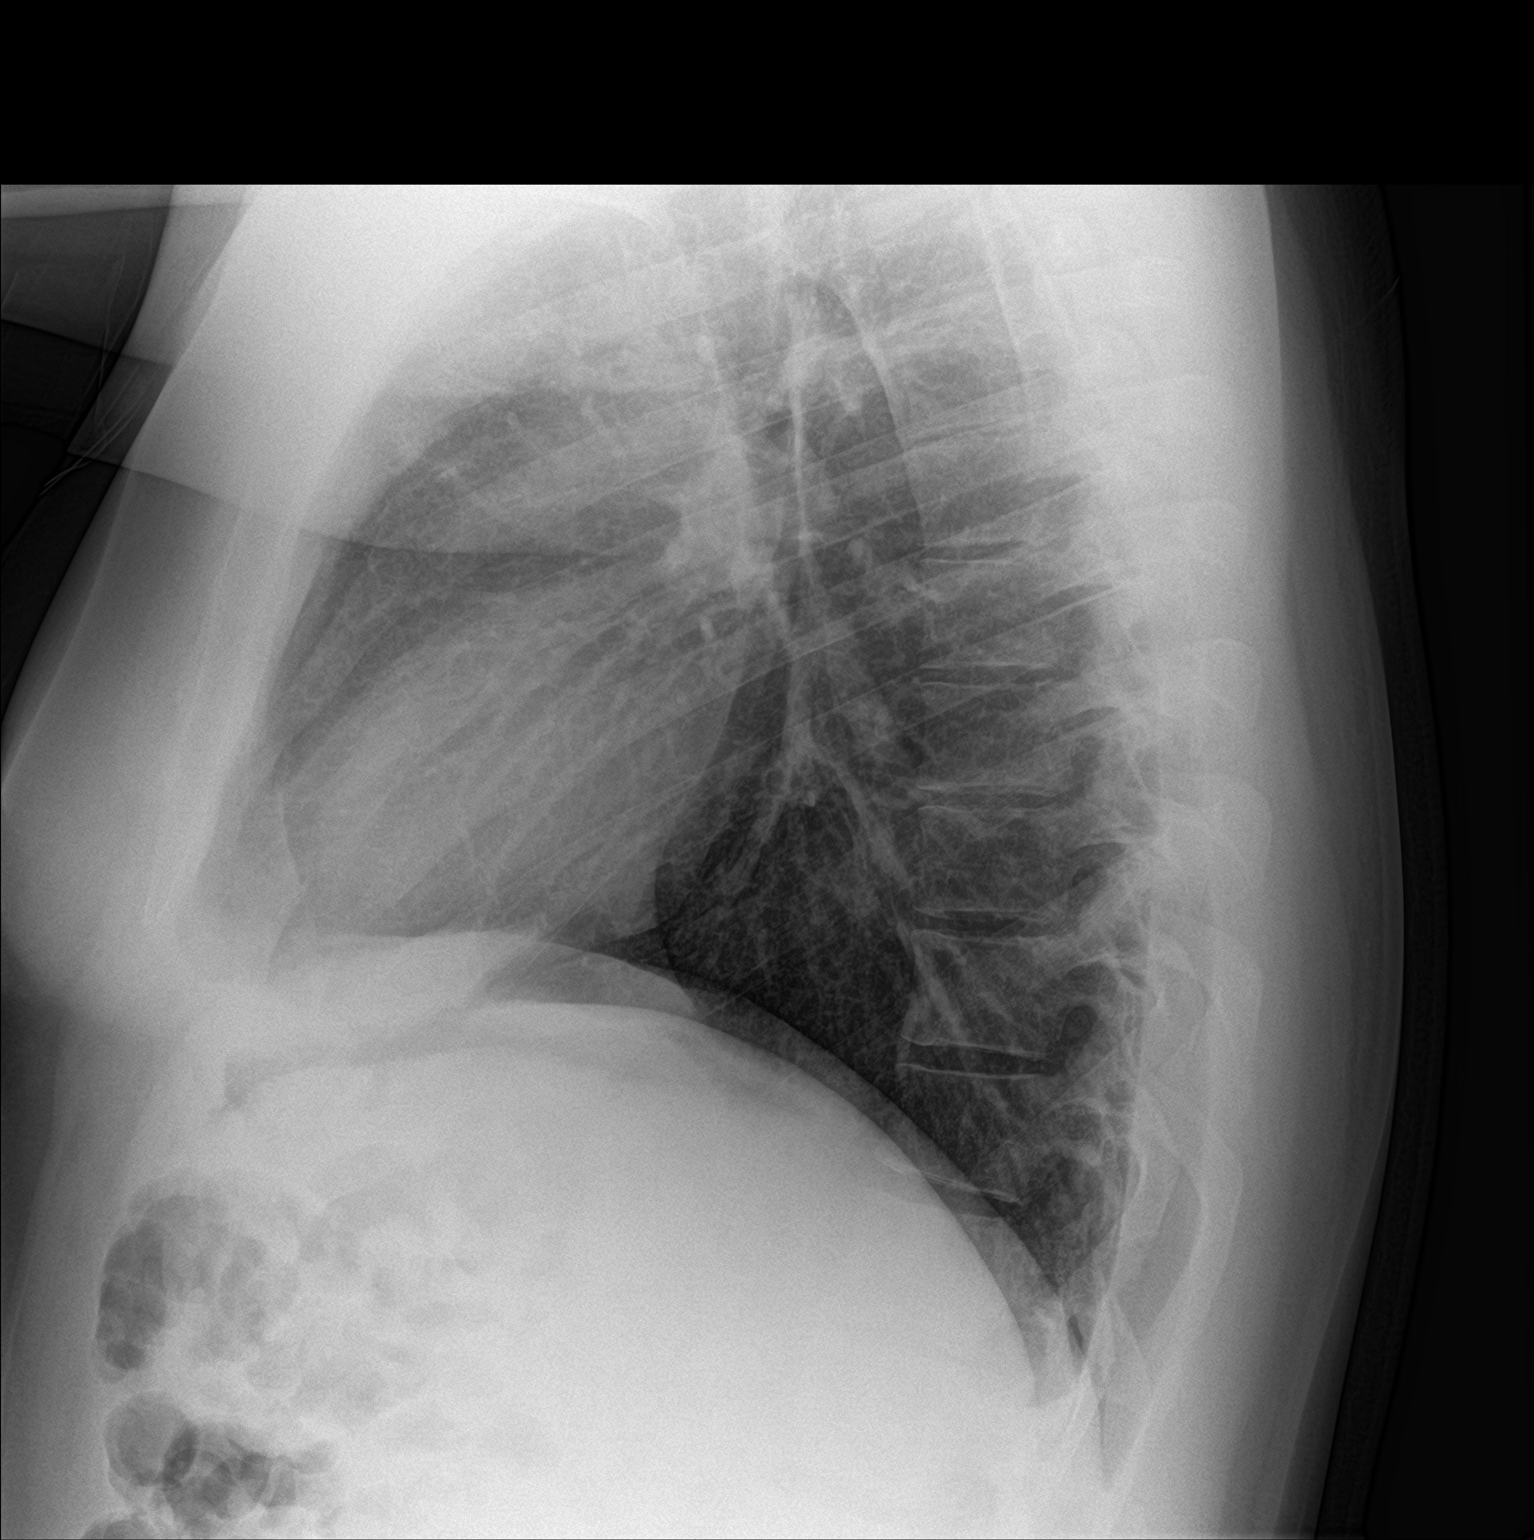

[2 of 2 positions shown; findings below may reference images not displayed]

FINDINGS: No consolidation, features of edema, pneumothorax, or effusion.
Pulmonary vascularity is normally distributed. The cardiomediastinal
contours are unremarkable. No acute osseous or soft tissue
abnormality.
IMPRESSION: No acute cardiopulmonary abnormality.

## 2022-02-22 IMAGING — US US ABDOMEN LIMITED
1 series · 14 of 25 positions shown · non-contrast
Comparison: None.

CLINICAL DATA: Right flank and upper abdominal pain

EXAM:
ULTRASOUND ABDOMEN LIMITED RIGHT UPPER QUADRANT

[Series 1: us abdomen limited ruq · 14 of 47 slices shown]
[im 1/47]
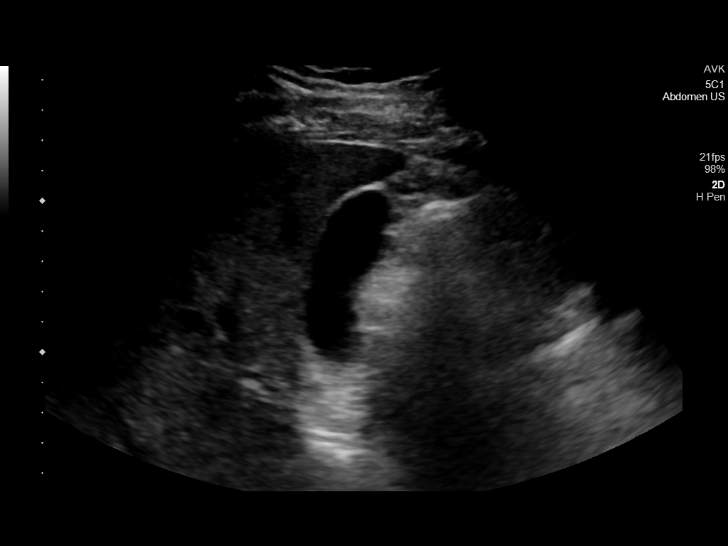
[im 4/47]
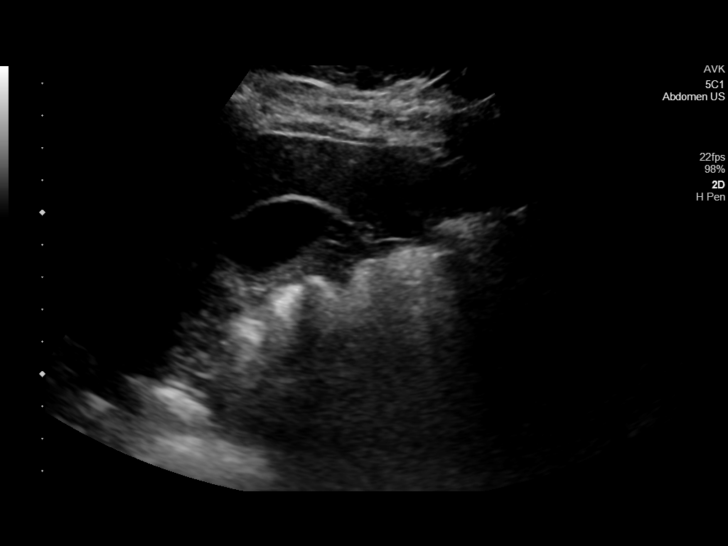
[im 8/47]
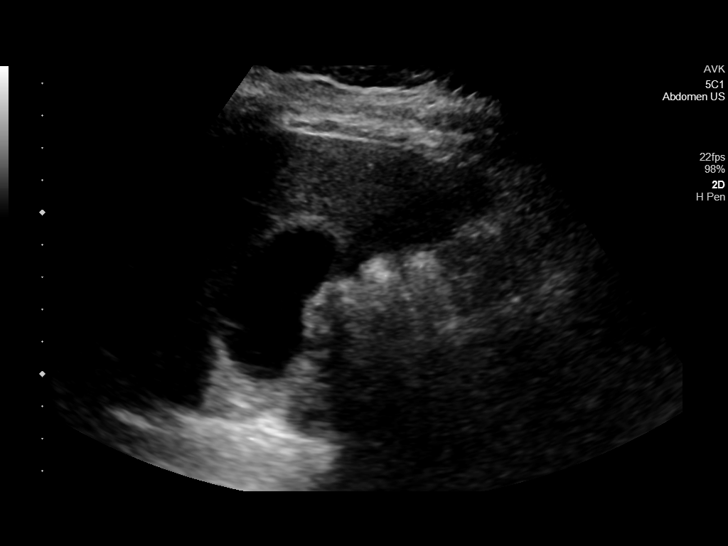
[im 12/47]
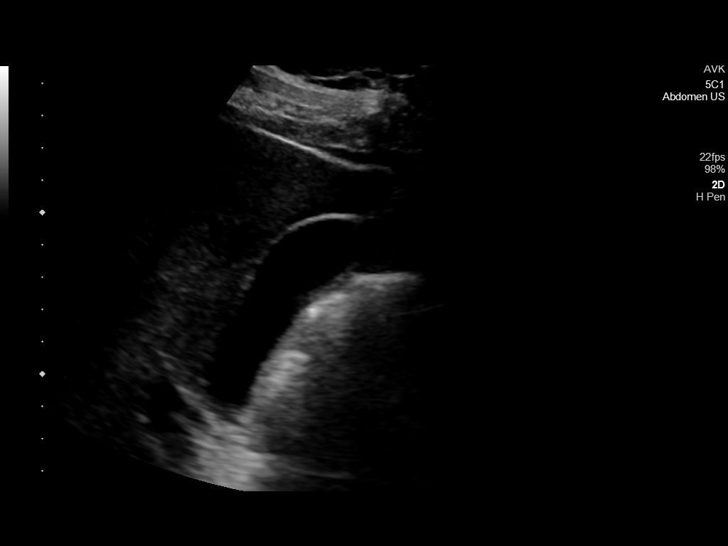
[im 16/47]
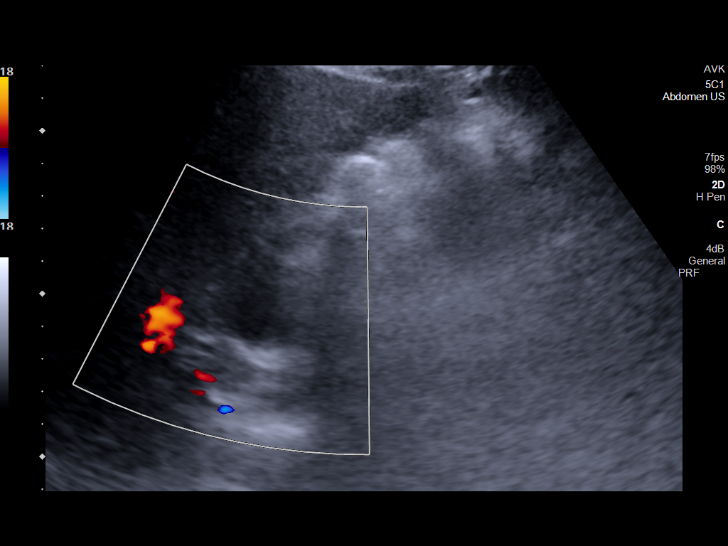
[im 18/47]
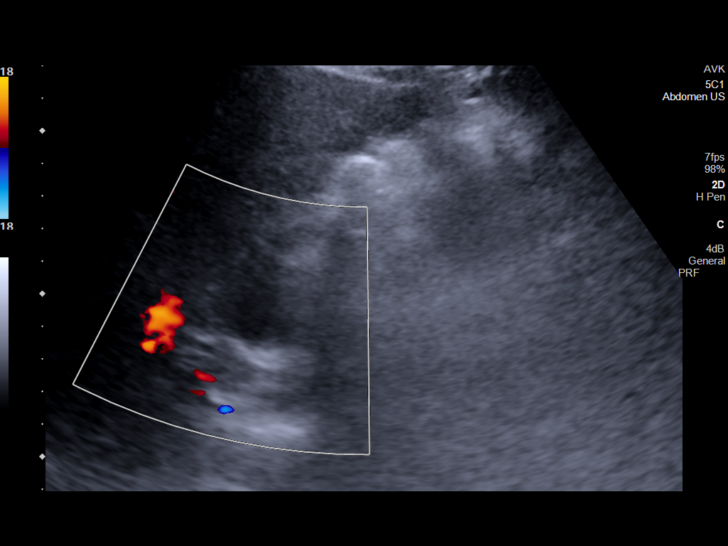
[im 22/47]
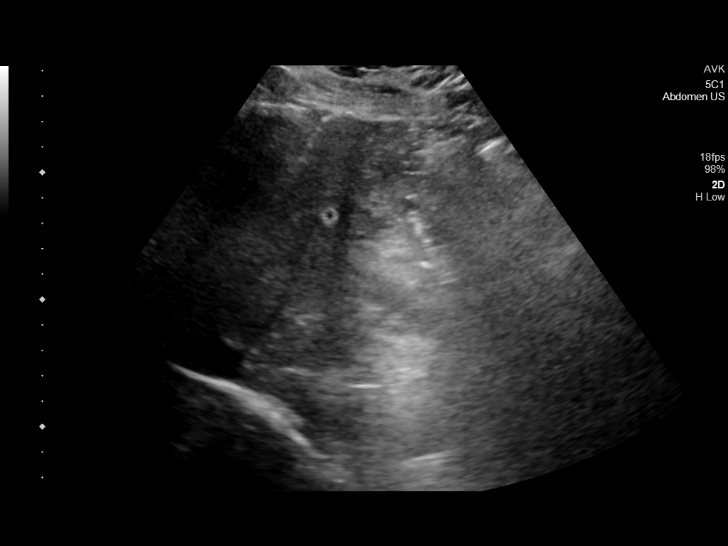
[im 25/47]
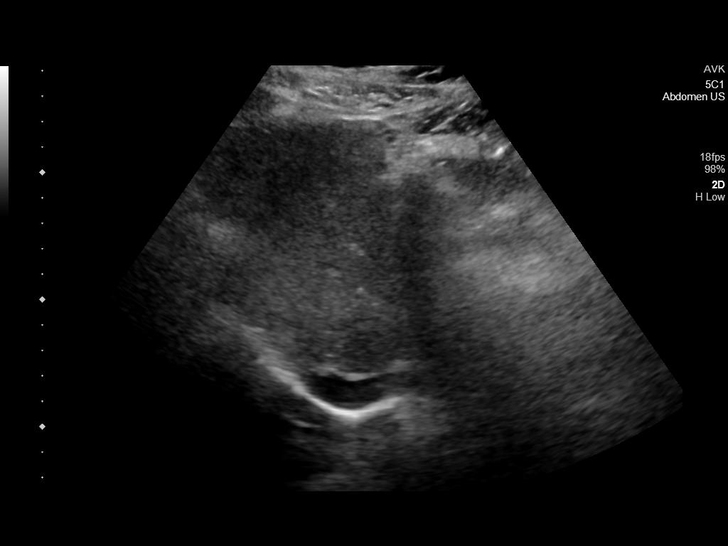
[im 29/47]
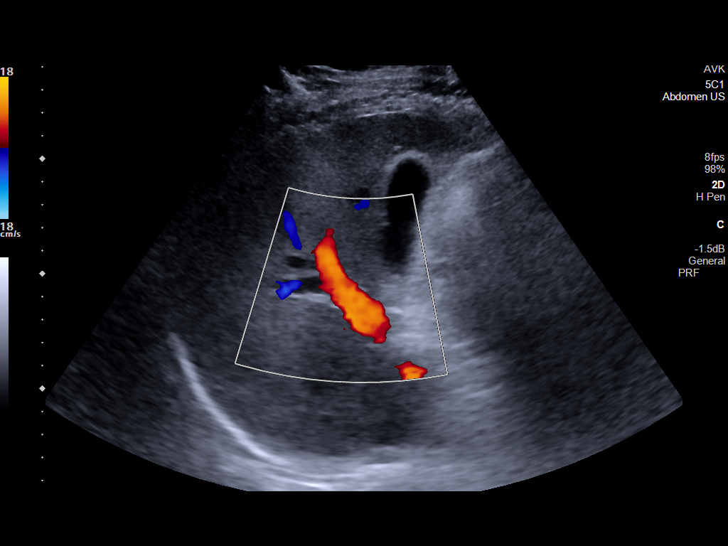
[im 31/47]
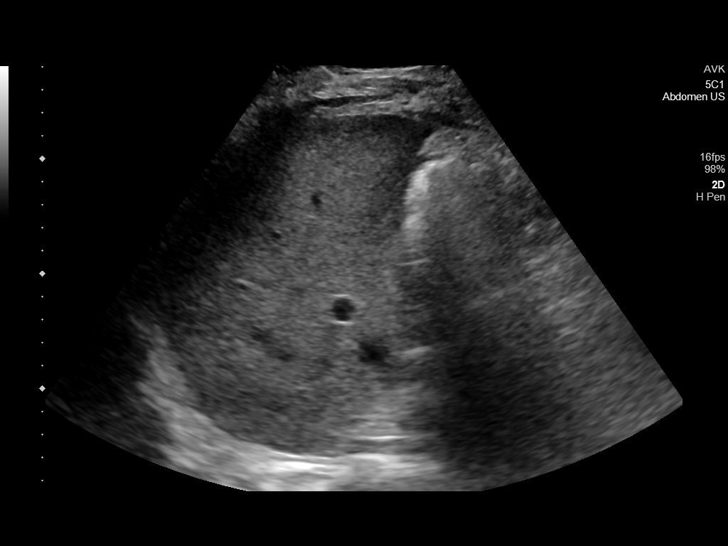
[im 35/47]
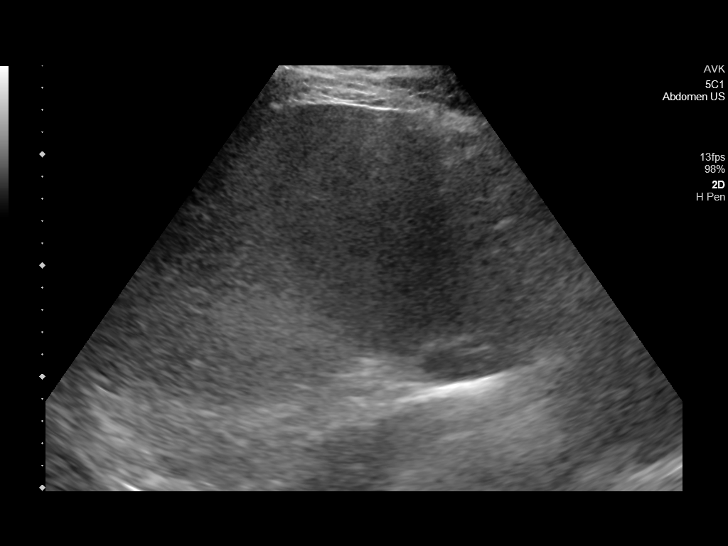
[im 39/47]
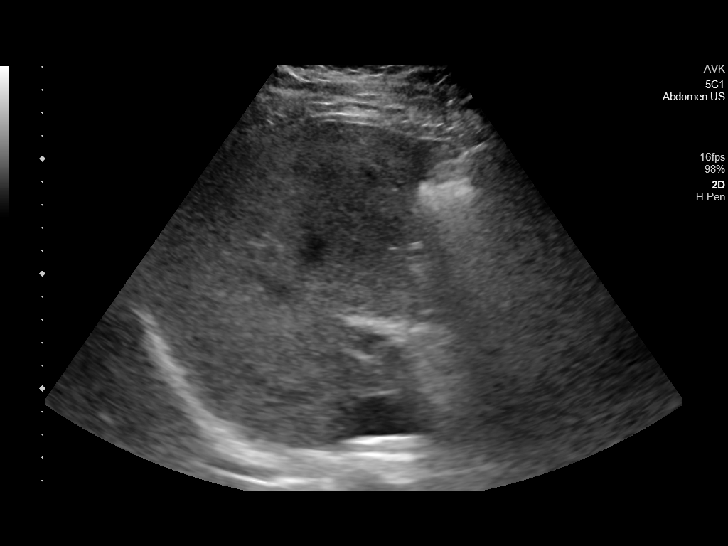
[im 43/47]
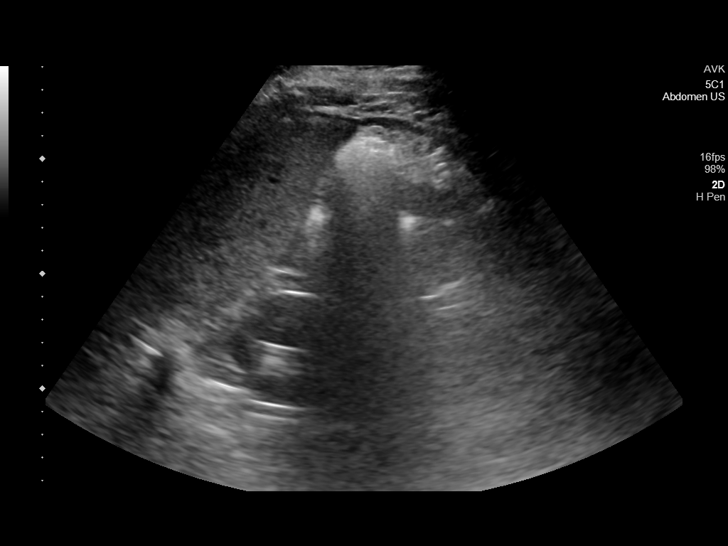
[im 47/47]
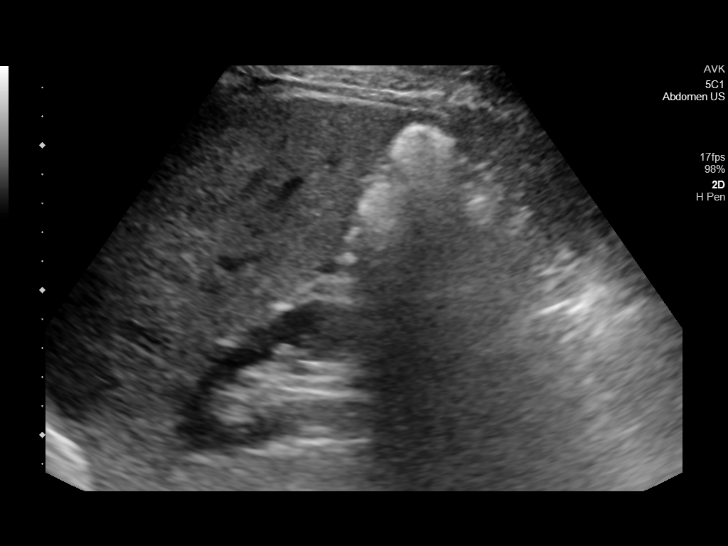

[14 of 25 positions shown; findings below may reference images not displayed]

FINDINGS: Gallbladder:

No gallstones or wall thickening visualized. No sonographic Murphy
sign noted by sonographer.

Common bile duct:

Diameter: 3 mm

Liver:

Increased echogenicity compatible with hepatic steatosis. No focal
abnormality by ultrasound. No biliary dilatation. Portal vein is
patent on color Doppler imaging with normal direction of blood flow
towards the liver.

Other: No free fluid or ascites
IMPRESSION: Hepatic steatosis.  No other acute finding by ultrasound.
